# Patient Record
Sex: Male | Born: 2000 | Race: Black or African American | Hispanic: Yes | Marital: Single | Smoking: Never smoker
Health system: Southern US, Community
[De-identification: ages and names within clinical notes are randomized; demographics above are authoritative.]

## PROBLEM LIST (undated history)

## (undated) DIAGNOSIS — K219 Gastro-esophageal reflux disease without esophagitis: Secondary | ICD-10-CM

## (undated) HISTORY — DX: Gastro-esophageal reflux disease without esophagitis: K21.9

---
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
## 2019-01-28 ENCOUNTER — Encounter (HOSPITAL_BASED_OUTPATIENT_CLINIC_OR_DEPARTMENT_OTHER): Payer: Self-pay | Admitting: Emergency Medicine

## 2019-01-28 ENCOUNTER — Emergency Department (HOSPITAL_BASED_OUTPATIENT_CLINIC_OR_DEPARTMENT_OTHER)
Admission: EM | Admit: 2019-01-28 | Discharge: 2019-01-28 | Disposition: A | Discharge status: Home / Self Care | Payer: No Typology Code available for payment source | Attending: Emergency Medicine | Admitting: Emergency Medicine

## 2019-01-28 ENCOUNTER — Other Ambulatory Visit: Payer: Self-pay

## 2019-01-28 ENCOUNTER — Emergency Department (HOSPITAL_BASED_OUTPATIENT_CLINIC_OR_DEPARTMENT_OTHER): Payer: No Typology Code available for payment source

## 2019-01-28 DIAGNOSIS — W2210XA Striking against or struck by unspecified automobile airbag, initial encounter: Secondary | ICD-10-CM | POA: Diagnosis not present

## 2019-01-28 DIAGNOSIS — Y9389 Activity, other specified: Secondary | ICD-10-CM | POA: Diagnosis not present

## 2019-01-28 DIAGNOSIS — M79632 Pain in left forearm: Secondary | ICD-10-CM | POA: Insufficient documentation

## 2019-01-28 DIAGNOSIS — Y998 Other external cause status: Secondary | ICD-10-CM | POA: Diagnosis not present

## 2019-01-28 DIAGNOSIS — R55 Syncope and collapse: Secondary | ICD-10-CM | POA: Insufficient documentation

## 2019-01-28 DIAGNOSIS — R0789 Other chest pain: Secondary | ICD-10-CM | POA: Diagnosis present

## 2019-01-28 DIAGNOSIS — Y92410 Unspecified street and highway as the place of occurrence of the external cause: Secondary | ICD-10-CM | POA: Insufficient documentation
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
## 2019-01-28 NOTE — Discharge Instructions (Signed)
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
## 2019-01-28 NOTE — ED Notes (Signed)
ED Provider at bedside. 

## 2019-01-28 NOTE — ED Notes (Signed)
Patient transported to X-ray 

## 2020-02-07 ENCOUNTER — Encounter (HOSPITAL_BASED_OUTPATIENT_CLINIC_OR_DEPARTMENT_OTHER): Payer: Self-pay | Admitting: Emergency Medicine

## 2020-02-07 ENCOUNTER — Other Ambulatory Visit: Payer: Self-pay

## 2020-02-07 ENCOUNTER — Emergency Department (HOSPITAL_BASED_OUTPATIENT_CLINIC_OR_DEPARTMENT_OTHER): Payer: Self-pay

## 2020-02-07 ENCOUNTER — Emergency Department (HOSPITAL_BASED_OUTPATIENT_CLINIC_OR_DEPARTMENT_OTHER)
Admission: EM | Admit: 2020-02-07 | Discharge: 2020-02-07 | Disposition: A | Discharge status: Home / Self Care | Payer: Self-pay | Attending: Emergency Medicine | Admitting: Emergency Medicine

## 2020-02-07 DIAGNOSIS — K219 Gastro-esophageal reflux disease without esophagitis: Secondary | ICD-10-CM | POA: Insufficient documentation

## 2020-02-07 MED ORDER — OMEPRAZOLE 20 MG PO CPDR: 20.0000 mg | DELAYED_RELEASE_CAPSULE | Freq: Every day | ORAL | 0 refills | Status: AC

## 2020-02-07 MED ORDER — ALUM & MAG HYDROXIDE-SIMETH 200-200-20 MG/5ML PO SUSP
30.0000 mL | Freq: Once | ORAL | Status: AC
  Administered 2020-02-07: 30 mL via ORAL
  Filled 2020-02-07: qty 30

## 2020-02-07 NOTE — ED Triage Notes (Signed)
Pt c/o left sided chest pain with shob. Movement exacerbates pain.

## 2020-02-07 NOTE — ED Provider Notes (Signed)
[facility] EMERGENCY DEPARTMENT Provider Note   CSN: [id number] Arrival date & time: 02/07/20  0150     History Chief Complaint  Patient presents with  . Chest Pain    Corey Davis is a 19 y.o. male.  The history is provided by the patient.  Chest Pain Pain location:  Substernal area Pain quality: burning and sharp   Pain radiates to:  Does not radiate Pain severity:  Moderate Onset quality:  Gradual Timing:  Constant Progression:  Unchanged Chronicity:  New Context: not drug use, not stress and not trauma   Relieved by:  Nothing Worsened by:  Nothing Ineffective treatments:  None tried Associated symptoms: no abdominal pain, no AICD problem, no altered mental status, no anorexia, no anxiety, no back pain, no claudication, no cough, no diaphoresis, no dizziness, no dysphagia, no fatigue, no fever, no headache, no heartburn, no lower extremity edema, no nausea, no near-syncope, no numbness, no orthopnea, no palpitations, no PND, no vomiting and no weakness   Patient with known GERD not on medication who ate a fried chicken sandwich and went to work and then had burning and superimposed sharp pains in the chest.  No DOE, no exertional symptoms.       Past Medical History:  Diagnosis Date  . GERD (gastroesophageal reflux disease)     There are no problems to display for this patient.   History reviewed. No pertinent surgical history.     Family History  Problem Relation Age of Onset  . Diabetes Other   . Hypertension Other     Social History   Tobacco Use  . Smoking status: Never Smoker  . Smokeless tobacco: Never Used  Substance Use Topics  . Alcohol use: Not Currently  . Drug use: Never    Home Medications Prior to Admission medications   Medication Sig Start Date End Date Taking? Authorizing Provider  omeprazole (PRILOSEC) 20 MG capsule Take 1 capsule (20 mg total) by mouth daily. 02/07/20   Bladyn Tipps, MD    Allergies     Tomato  Review of Systems   Review of Systems  Constitutional: Negative for diaphoresis, fatigue and fever.  HENT: Negative for trouble swallowing.   Eyes: Negative for visual disturbance.  Respiratory: Negative for cough.   Cardiovascular: Positive for chest pain. Negative for palpitations, orthopnea, claudication, PND and near-syncope.  Gastrointestinal: Negative for abdominal pain, anorexia, heartburn, nausea and vomiting.  Genitourinary: Negative for difficulty urinating.  Musculoskeletal: Negative for back pain.  Neurological: Negative for dizziness, weakness, numbness and headaches.  Psychiatric/Behavioral: Negative for agitation.  All other systems reviewed and are negative.   Physical Exam Updated Vital Signs BP (!) 152/57   Pulse (!) 53   Temp 98.3 F (36.8 C) (Oral)   Resp 13   Ht 5\' 4"  (1.626 m)   Wt 81.6 kg   SpO2 97%   BMI 30.90 kg/m   Physical Exam Vitals and nursing note reviewed.  Constitutional:      General: He is not in acute distress.    Appearance: Normal appearance.  HENT:     Head: Normocephalic and atraumatic.     Nose: Nose normal.  Eyes:     Conjunctiva/sclera: Conjunctivae normal.     Pupils: Pupils are equal, round, and reactive to light.  Cardiovascular:     Rate and Rhythm: Normal rate and regular rhythm.     Pulses: Normal pulses.     Heart sounds: Normal heart sounds.  Pulmonary:  Effort: Pulmonary effort is normal.     Breath sounds: Normal breath sounds.  Abdominal:     General: Abdomen is flat. Bowel sounds are normal.     Palpations: Abdomen is soft.     Tenderness: There is no abdominal tenderness. There is no guarding or rebound.  Musculoskeletal:        General: No tenderness. Normal range of motion.     Cervical back: Normal range of motion and neck supple.     Right lower leg: No edema.     Left lower leg: No edema.  Skin:    General: Skin is warm and dry.     Capillary Refill: Capillary refill takes less than 2  seconds.  Neurological:     General: No focal deficit present.     Mental Status: He is alert and oriented to person, place, and time.     Deep Tendon Reflexes: Reflexes normal.  Psychiatric:        Mood and Affect: Mood normal.        Behavior: Behavior normal.     ED Results / Procedures / Treatments   Labs (all labs ordered are listed, but only abnormal results are displayed) Labs Reviewed - No data to display  EKG EKG Interpretation  Date/Time:  Saturday February 07 2020 02:00:55 EST Ventricular Rate:  53 PR Interval:    QRS Duration: 91 QT Interval:  385 QTC Calculation: 362 R Axis:   15 Text Interpretation: Sinus rhythm Atrial premature complex Confirmed by Nicanor Alcon, Lot [place]ford ([id number]) on 02/07/2020 2:26:05 AM   Radiology No results found.  Procedures Procedures (including critical care time)  Medications Ordered in ED Medications  alum & mag hydroxide-simeth (MAALOX/MYLANTA) 200-200-20 MG/5ML suspension 30 mL (30 mLs Oral Given 02/07/20 0231)    ED Course  I have reviewed the triage vital signs and the nursing notes.  Pertinent labs & imaging results that were available during my care of the patient were reviewed by me and considered in my medical decision making (see chart for details).    PERC negative wells 0 highly doubt PE in this low risk patient.  EKG is normal.  I do not believe this is cardiac.  Symptoms are consistent with GERD.  Patient is off his GERD medication.  Has been given GI cocktail with relief of symptoms.  Patient will be placed on a gerd friendly diet and we will start a PPI.  Strict return precautions given.    Corey Davis was evaluated in Emergency Department on 02/07/2020 for the symptoms described in the history of present illness. He was evaluated in the context of the global COVID-19 pandemic, which necessitated consideration that the patient might be at risk for infection with the SARS-CoV-2 virus that causes COVID-19. Institutional  protocols and algorithms that pertain to the evaluation of patients at risk for COVID-19 are in a state of rapid change based on information released by regulatory bodies including the CDC and federal and state organizations. These policies and algorithms were followed during the patient's care in the ED.  Final Clinical Impression(s) / ED Diagnoses Final diagnoses:  Gastroesophageal reflux disease, unspecified whether esophagitis present   Return for intractable cough, coughing up blood,fevers >100.4 unrelieved by medication, shortness of breath, intractable vomiting, chest pain, shortness of breath, weakness,numbness, changes in speech, facial asymmetry,abdominal pain, passing out,Inability to tolerate liquids or food, cough, altered mental status or any concerns. No signs of systemic illness or infection. The patient is nontoxic-appearing on exam and  vital signs are within normal limits.   I have reviewed the triage vital signs and the nursing notes. Pertinent labs &imaging results that were available during my care of the patient were reviewed by me and considered in my medical decision making (see chart for details).After history, exam, and medical workup I feel the patient has beenappropriately medically screened and is safe for discharge home. Pertinent diagnoses were discussed with the patient. Patient was given return precautions. Rx / DC Orders ED Discharge Orders         Ordered    omeprazole (PRILOSEC) 20 MG capsule  Daily        02/07/20 0302           Darriel Utter, MD 02/07/20 [phone]

## 2020-02-07 NOTE — ED Notes (Signed)
Pt stated " I don't think I need a chest xray"

## 2020-02-07 NOTE — ED Notes (Signed)
PO challenge successful.
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
## 2024-04-25 ENCOUNTER — Emergency Department (HOSPITAL_BASED_OUTPATIENT_CLINIC_OR_DEPARTMENT_OTHER)

## 2024-04-25 ENCOUNTER — Other Ambulatory Visit: Payer: Self-pay

## 2024-04-25 ENCOUNTER — Emergency Department (HOSPITAL_BASED_OUTPATIENT_CLINIC_OR_DEPARTMENT_OTHER)
Admission: EM | Admit: 2024-04-25 | Discharge: 2024-04-25 | Disposition: A | Source: Home / Self Care | Attending: Emergency Medicine | Admitting: Emergency Medicine

## 2024-04-25 ENCOUNTER — Encounter (HOSPITAL_BASED_OUTPATIENT_CLINIC_OR_DEPARTMENT_OTHER): Payer: Self-pay

## 2024-04-25 DIAGNOSIS — R55 Syncope and collapse: Secondary | ICD-10-CM

## 2024-04-25 DIAGNOSIS — R0789 Other chest pain: Secondary | ICD-10-CM

## 2024-04-25 LAB — BASIC METABOLIC PANEL WITH GFR
Anion gap: 10 (ref 5–15)
BUN: 16 mg/dL (ref 6–20)
CO2: 27 mmol/L (ref 22–32)
Calcium: 9.9 mg/dL (ref 8.9–10.3)
Chloride: 104 mmol/L (ref 98–111)
Creatinine, Ser: 1.17 mg/dL (ref 0.61–1.24)
GFR, Estimated: >60 mL/min
Glucose, Bld: 87 mg/dL (ref 70–99)
Potassium: 3.9 mmol/L (ref 3.5–5.1)
Sodium: 141 mmol/L (ref 135–145)

## 2024-04-25 LAB — CBC
HCT: 44.3 % (ref 39.0–52.0)
Hemoglobin: 14.5 g/dL (ref 13.0–17.0)
MCH: 27.8 pg (ref 26.0–34.0)
MCHC: 32.7 g/dL (ref 30.0–36.0)
MCV: 84.9 fL (ref 80.0–100.0)
Platelets: 259 10*3/uL (ref 150–400)
RBC: 5.22 MIL/uL (ref 4.22–5.81)
RDW: 13.4 % (ref 11.5–15.5)
WBC: 6.1 10*3/uL (ref 4.0–10.5)
nRBC: 0 % (ref 0.0–0.2)

## 2024-04-25 LAB — HEPATIC FUNCTION PANEL
ALT: 25 U/L (ref 0–44)
AST: 19 U/L (ref 15–41)
Albumin: 4.8 g/dL (ref 3.5–5.0)
Alkaline Phosphatase: 72 U/L (ref 38–126)
Bilirubin, Direct: 0.2 mg/dL (ref 0.0–0.2)
Indirect Bilirubin: 0.3 mg/dL (ref 0.3–0.9)
Total Bilirubin: 0.5 mg/dL (ref 0.0–1.2)
Total Protein: 7.6 g/dL (ref 6.5–8.1)

## 2024-04-25 LAB — TROPONIN T, HIGH SENSITIVITY: Troponin T High Sensitivity: <6 ng/L (ref 0–19)

## 2024-04-25 LAB — LIPASE, BLOOD: Lipase: 29 U/L (ref 11–51)

## 2024-04-25 NOTE — Discharge Instructions (Signed)
You have been seen today in the Emergency Department (ED)  for near-syncope (almost passing out).  Your workup including labs and EKG show reassuring results.  Your symptoms may be due to dehydration, so it is important that you drink plenty of non-alcoholic fluids. ° °Please call your regular doctor as soon as possible to schedule the next available clinic appointment to follow up with him/her regarding your visit to the ED and your symptoms.  Return to the Emergency Department (ED)  if you have any further syncopal episodes (pass out again) or develop ANY chest pain, pressure, tightness, trouble breathing, sudden sweating, or other symptoms that concern you. ° °

## 2024-04-25 NOTE — ED Provider Notes (Signed)
 "  Emergency Department Provider Note   I have reviewed the triage vital signs and the nursing notes.   HISTORY  Chief Complaint Chest Pain and Nausea   HPI Corey Davis is a 24 y.o. male with past history of GERD presents to the emergency department with lightheadedness and near syncope.  He had some sharp/tingly central chest discomfort just prior to passing out but that has not persisted.  No sharp/pleuritic pain or shortness of breath.  No leg swelling or pain.  He notes an episode last year where he passed out while working out but did not ultimately seek care for that issue.  He has no known history of arrhythmia or other cardiac issue.  Right now he is feeling well.  Today he was actually at work coming off his break when he developed symptoms above including nausea.  No vomiting or diarrhea.    Past Medical History:  Diagnosis Date   GERD (gastroesophageal reflux disease)     Review of Systems  Constitutional: No fever/chills Cardiovascular: Positive chest pain and near syncope.  Respiratory: Denies shortness of breath. Gastrointestinal: No abdominal pain. Positive nausea.  Neurological: Negative for headaches. ____________________________________________   PHYSICAL EXAM:  VITAL SIGNS: ED Triage Vitals  Encounter Vitals Group     BP 04/25/24 0009 (!) 144/76     Pulse Rate 04/25/24 0009 60     Resp 04/25/24 0009 15     Temp 04/25/24 0009 98.5 F (36.9 C)     Temp Source 04/25/24 0009 Oral     SpO2 04/25/24 0009 98 %     Weight 04/25/24 0011 187 lb (84.8 kg)     Height 04/25/24 0011 5' 4 (1.626 m)   Constitutional: Alert and oriented. Well appearing and in no acute distress. Eyes: Conjunctivae are normal.  Head: Atraumatic. Nose: No congestion/rhinnorhea. Mouth/Throat: Mucous membranes are moist.   Neck: No stridor.   Cardiovascular: Normal rate, regular rhythm. Good peripheral circulation. Grossly normal heart sounds.   Respiratory: Normal respiratory  effort.  No retractions. Lungs CTAB. Gastrointestinal: Soft and nontender. No distention.  Musculoskeletal: No gross deformities of extremities. Neurologic:  Normal speech and language.  Skin:  Skin is warm, dry and intact. No rash noted.   ____________________________________________   LABS (all labs ordered are listed, but only abnormal results are displayed)  Labs Reviewed  BASIC METABOLIC PANEL WITH GFR  CBC  HEPATIC FUNCTION PANEL  LIPASE, BLOOD  TROPONIN T, HIGH SENSITIVITY   ____________________________________________  EKG   EKG Interpretation Date/Time:  Friday April 25 2024 00:23:32 EST Ventricular Rate:  60 PR Interval:  121 QRS Duration:  95 QT Interval:  386 QTC Calculation: 386 R Axis:   41  Text Interpretation: Sinus rhythm No STEMI Confirmed by Darra Chew [phone]) on 04/25/2024 12:27:03 AM        ____________________________________________  RADIOLOGY  DG Chest 2 View Result Date: 04/25/2024 EXAM: 2 VIEW(S) XRAY OF THE CHEST 04/25/2024 12:45:00 AM COMPARISON: 01/28/19 CLINICAL HISTORY: Chest pain. FINDINGS: LUNGS AND PLEURA: No focal pulmonary opacity. No pleural effusion. No pneumothorax. HEART AND MEDIASTINUM: No acute abnormality of the cardiac and mediastinal silhouettes. BONES AND SOFT TISSUES: No acute osseous abnormality. IMPRESSION: 1. No acute cardiopulmonary abnormality. Electronically signed by: Pinkie Pebbles MD 04/25/2024 12:51 AM EST RP Workstation: [id number]    ____________________________________________   PROCEDURES  Procedure(s) performed:   Procedures  None  ____________________________________________   INITIAL IMPRESSION / ASSESSMENT AND PLAN / ED COURSE  Pertinent labs & imaging results  that were available during my care of the patient were reviewed by me and considered in my medical decision making (see chart for details).   This patient is Presenting for Evaluation of CP, which does require a range of treatment  options, and is a complaint that involves a high risk of morbidity and mortality.  The Differential Diagnoses includes but is not exclusive to acute coronary syndrome, aortic dissection, pulmonary embolism, cardiac tamponade, community-acquired pneumonia, pericarditis, musculoskeletal chest wall pain, etc.  Clinical Laboratory Tests Ordered, included CBC without leukocytosis or anemia. No AKI. LFTs normal. Troponin negative.   Radiologic Tests Ordered, included CXR. I independently interpreted the images and agree with radiology interpretation.   Cardiac Monitor Tracing which shows NSR.    Social Determinants of Health Risk patient is a non-smoker.   Medical Decision Making: Summary:  Patient was emergency department with very atypical tingling type chest discomfort and near syncope.  ACS is lower on my differential given lack of risk factors and age along with atypical chest pain presentation.  Considered PE but vital signs are largely unremarkable other than mildly elevated BP.  Chest pain is very atypical for PE.  EKG unremarkable.  Reevaluation with update and discussion with patient. Plan for Cardiology referral with recurrent syncope/near syncope.   Considered admission considered admit but symptoms improved and stable for outpatient referral/follow up.   Patient's presentation is most consistent with acute presentation with potential threat to life or bodily function.   Disposition: discharge  ____________________________________________  FINAL CLINICAL IMPRESSION(S) / ED DIAGNOSES  Final diagnoses:  Near syncope  Atypical chest pain    Note:  This document was prepared using Dragon voice recognition software and may include unintentional dictation errors.  Fonda Law, MD, [facility] Emergency Medicine    Leana Springston, Fonda MATSU, MD 04/25/24 [phone]  "

## 2024-04-25 NOTE — ED Triage Notes (Signed)
 Pt arrived from home via pov c/o chest pain described as tightness 7/10. Pt states that he is also nauseated and feels fatigued.

## 2024-04-30 ENCOUNTER — Ambulatory Visit: Admitting: Physician Assistant
